# Patient Record
Sex: Male | Born: 1935 | Race: White | Hispanic: No | Smoking: Never smoker
Health system: Southern US, Community
[De-identification: ages and names within clinical notes are randomized; demographics above are authoritative.]

## PROBLEM LIST (undated history)

## (undated) DIAGNOSIS — G1221 Amyotrophic lateral sclerosis: Secondary | ICD-10-CM

## (undated) DIAGNOSIS — I10 Essential (primary) hypertension: Secondary | ICD-10-CM

## (undated) HISTORY — PX: APPENDECTOMY: SHX54

## (undated) HISTORY — PX: HERNIA REPAIR: SHX51

---

## 2015-07-19 ENCOUNTER — Emergency Department (HOSPITAL_BASED_OUTPATIENT_CLINIC_OR_DEPARTMENT_OTHER)
Admission: EM | Admit: 2015-07-19 | Discharge: 2015-07-19 | Disposition: A | Discharge status: Home / Self Care | Payer: Medicare Other | Attending: Emergency Medicine | Admitting: Emergency Medicine

## 2015-07-19 ENCOUNTER — Emergency Department (HOSPITAL_BASED_OUTPATIENT_CLINIC_OR_DEPARTMENT_OTHER): Payer: Medicare Other

## 2015-07-19 ENCOUNTER — Encounter (HOSPITAL_BASED_OUTPATIENT_CLINIC_OR_DEPARTMENT_OTHER): Payer: Self-pay | Admitting: *Deleted

## 2015-07-19 DIAGNOSIS — R21 Rash and other nonspecific skin eruption: Secondary | ICD-10-CM | POA: Diagnosis not present

## 2015-07-19 DIAGNOSIS — R630 Anorexia: Secondary | ICD-10-CM | POA: Diagnosis not present

## 2015-07-19 DIAGNOSIS — R109 Unspecified abdominal pain: Secondary | ICD-10-CM | POA: Diagnosis not present

## 2015-07-19 DIAGNOSIS — K838 Other specified diseases of biliary tract: Secondary | ICD-10-CM | POA: Insufficient documentation

## 2015-07-19 DIAGNOSIS — I10 Essential (primary) hypertension: Secondary | ICD-10-CM | POA: Insufficient documentation

## 2015-07-19 DIAGNOSIS — R110 Nausea: Secondary | ICD-10-CM

## 2015-07-19 DIAGNOSIS — R112 Nausea with vomiting, unspecified: Secondary | ICD-10-CM | POA: Diagnosis present

## 2015-07-19 DIAGNOSIS — Z8669 Personal history of other diseases of the nervous system and sense organs: Secondary | ICD-10-CM | POA: Diagnosis not present

## 2015-07-19 HISTORY — DX: Amyotrophic lateral sclerosis: G12.21

## 2015-07-19 HISTORY — DX: Essential (primary) hypertension: I10

## 2015-07-19 LAB — COMPREHENSIVE METABOLIC PANEL
ALBUMIN: 4.5 g/dL (ref 3.5–5.0)
ALT: 24 U/L (ref 17–63)
ANION GAP: 5 (ref 5–15)
AST: 39 U/L (ref 15–41)
Alkaline Phosphatase: 63 U/L (ref 38–126)
BILIRUBIN TOTAL: 1.4 mg/dL — AB (ref 0.3–1.2)
BUN: 22 mg/dL — ABNORMAL HIGH (ref 6–20)
CHLORIDE: 102 mmol/L (ref 101–111)
CO2: 31 mmol/L (ref 22–32)
Calcium: 10.3 mg/dL (ref 8.9–10.3)
Creatinine, Ser: 0.66 mg/dL (ref 0.61–1.24)
GFR calc non Af Amer: >60 mL/min (ref >60)
GLUCOSE: 98 mg/dL (ref 65–99)
Potassium: 3.7 mmol/L (ref 3.5–5.1)
SODIUM: 138 mmol/L (ref 135–145)
Total Protein: 8.3 g/dL — ABNORMAL HIGH (ref 6.5–8.1)

## 2015-07-19 LAB — CBC WITH DIFFERENTIAL/PLATELET
Basophils Absolute: 0.1 10*3/uL (ref 0.0–0.1)
Basophils Relative: 1 %
Eosinophils Absolute: 0.6 10*3/uL (ref 0.0–0.7)
Eosinophils Relative: 6 %
HEMATOCRIT: 45.7 % (ref 39.0–52.0)
HEMOGLOBIN: 15.5 g/dL (ref 13.0–17.0)
LYMPHS ABS: 1.6 10*3/uL (ref 0.7–4.0)
LYMPHS PCT: 17 %
MCH: 28.8 pg (ref 26.0–34.0)
MCHC: 33.9 g/dL (ref 30.0–36.0)
MCV: 84.8 fL (ref 78.0–100.0)
MONOS PCT: 7 %
Monocytes Absolute: 0.7 10*3/uL (ref 0.1–1.0)
NEUTROS ABS: 6.7 10*3/uL (ref 1.7–7.7)
NEUTROS PCT: 69 %
Platelets: 262 10*3/uL (ref 150–400)
RBC: 5.39 MIL/uL (ref 4.22–5.81)
RDW: 14.3 % (ref 11.5–15.5)
WBC: 9.6 10*3/uL (ref 4.0–10.5)

## 2015-07-19 LAB — URINALYSIS, ROUTINE W REFLEX MICROSCOPIC
Bilirubin Urine: NEGATIVE
GLUCOSE, UA: NEGATIVE mg/dL
HGB URINE DIPSTICK: NEGATIVE
Ketones, ur: 15 mg/dL — AB
LEUKOCYTES UA: NEGATIVE
Nitrite: NEGATIVE
PH: 5.5 (ref 5.0–8.0)
PROTEIN: NEGATIVE mg/dL
SPECIFIC GRAVITY, URINE: 1.017 (ref 1.005–1.030)
Urobilinogen, UA: 1 mg/dL (ref 0.0–1.0)

## 2015-07-19 LAB — TROPONIN I: Troponin I: 0.03 ng/mL (ref <0.031)

## 2015-07-19 MED ORDER — IOHEXOL 300 MG/ML  SOLN
25.0000 mL | Freq: Once | INTRAMUSCULAR | Status: AC | PRN
  Administered 2015-07-19: 25 mL via ORAL

## 2015-07-19 MED ORDER — SODIUM CHLORIDE 0.9 % IV BOLUS (SEPSIS)
1000.0000 mL | Freq: Once | INTRAVENOUS | Status: AC
  Administered 2015-07-19: 1000 mL via INTRAVENOUS

## 2015-07-19 MED ORDER — IOHEXOL 300 MG/ML  SOLN
100.0000 mL | Freq: Once | INTRAMUSCULAR | Status: AC | PRN
  Administered 2015-07-19: 100 mL via INTRAVENOUS

## 2015-07-19 MED ORDER — ONDANSETRON HCL 4 MG/2ML IJ SOLN
4.0000 mg | Freq: Once | INTRAMUSCULAR | Status: AC
  Administered 2015-07-19: 4 mg via INTRAVENOUS
  Filled 2015-07-19: qty 2

## 2015-07-19 MED ORDER — ONDANSETRON 4 MG PREPACK (~~LOC~~): 1.0000 | ORAL_TABLET | Freq: Three times a day (TID) | ORAL | Status: AC | PRN

## 2015-07-19 MED ORDER — METOPROLOL TARTRATE 1 MG/ML IV SOLN
5.0000 mg | Freq: Once | INTRAVENOUS | Status: AC
  Administered 2015-07-19: 5 mg via INTRAVENOUS
  Filled 2015-07-19: qty 5

## 2015-07-19 MED ORDER — DIPHENHYDRAMINE HCL 50 MG/ML IJ SOLN
25.0000 mg | Freq: Once | INTRAMUSCULAR | Status: AC
  Administered 2015-07-19: 25 mg via INTRAVENOUS
  Filled 2015-07-19: qty 1

## 2015-07-19 NOTE — ED Notes (Signed)
MD at the bedside  

## 2015-07-19 NOTE — ED Notes (Signed)
Applesauce and water given.

## 2015-07-19 NOTE — ED Notes (Signed)
I was able to assist patient to stand and give urine sample, patient back in bed with rails up and made comfortable with a blanket.

## 2015-07-19 NOTE — ED Notes (Signed)
Patient states he is unable to give urine sample at this time.

## 2015-07-19 NOTE — ED Notes (Signed)
Pt. Reports he has had nausea and vomited x 1.  Family believes the Pt. May have aspirated his vomit.

## 2015-07-19 NOTE — ED Provider Notes (Signed)
CSN: [id number]     Arrival date & time 07/19/15  1431 History   First MD Initiated Contact with Patient 07/19/15 1447     Chief Complaint  Patient presents with  . Nausea     (Consider location/radiation/quality/duration/timing/severity/associated sxs/prior Treatment) HPI  Patient with approximately 36 hours of nausea 4 episodes of dark green vomiting but no fevers, abdominal pain or other symptoms. Has last bowel movement this morning. No history of the same. No sick contacts or recent travels. No new medications or problems with his gallbladder in the past.  Past Medical History  Diagnosis Date  . ALS (amyotrophic lateral sclerosis) (HCC)   . Hypertension    Past Surgical History  Procedure Laterality Date  . Hernia repair    . Appendectomy     No family history on file. Social History  Substance Use Topics  . Smoking status: Never Smoker   . Smokeless tobacco: Never Used  . Alcohol Use: No    Review of Systems  Constitutional: Positive for appetite change. Negative for fever, chills and activity change.  Eyes: Negative for pain and itching.  Respiratory: Negative for cough and shortness of breath.   Gastrointestinal: Positive for nausea and vomiting. Negative for abdominal pain, diarrhea and constipation.  Endocrine: Negative for polydipsia and polyuria.  Genitourinary: Negative for dysuria, hematuria and difficulty urinating.  Musculoskeletal: Negative for back pain and neck pain.  Skin: Positive for rash (upper chest, back, arms and back). Negative for wound.  Neurological: Negative for dizziness and headaches.  Psychiatric/Behavioral: Negative for agitation.      Allergies  Review of patient's allergies indicates no known allergies.  Home Medications   Prior to Admission medications   Medication Sig Start Date End Date Taking? Authorizing Provider  ondansetron (ZOFRAN) 4 mg TABS tablet Take 4 tablets by mouth every 8 (eight) hours as needed. 07/19/15    Macallister Ashmead, MD   BP 182/99 mmHg  Pulse 67  Temp(Src) 98 F (36.7 C) (Oral)  Resp 19  SpO2 98% Physical Exam  Constitutional: He appears well-developed and well-nourished.  HENT:  Head: Normocephalic and atraumatic.  Mouth/Throat: Mucous membranes are dry.  Neck: Normal range of motion.  Cardiovascular: Normal rate.   Pulmonary/Chest: Effort normal. No respiratory distress. He has no wheezes. He has no rales.  Abdominal: He exhibits no distension.  Musculoskeletal: Normal range of motion.  Neurological: He is alert.  Skin: Rash (rash and excoriations over upper back and chest, proximal extremities) noted.  Nursing note and vitals reviewed.   ED Course  Procedures (including critical care time) Labs Review Labs Reviewed  COMPREHENSIVE METABOLIC PANEL - Abnormal; Notable for the following:    BUN 22 (*)    Total Protein 8.3 (*)    Total Bilirubin 1.4 (*)    All other components within normal limits  URINALYSIS, ROUTINE W REFLEX MICROSCOPIC (NOT AT [facility]) - Abnormal; Notable for the following:    Ketones, ur 15 (*)    All other components within normal limits  TROPONIN I  CBC WITH DIFFERENTIAL/PLATELET    Imaging Review Dg Chest 2 View  07/19/2015  CLINICAL DATA:  Cough, especially after eating. Vomiting for 2 days. Possible aspiration. EXAM: CHEST  2 VIEW COMPARISON:  None. FINDINGS: Heart size is normal. Overall cardiomediastinal silhouette is within normal limits in size and configuration, with mild age- related aortic ectasia. There is mild scarring/atelectasis at the left lung base. Lungs appear otherwise clear. No evidence of pneumonia or aspiration. No pleural  effusion. No pneumothorax. Mild degenerative change noted within the slightly kyphotic thoracic spine. No acute osseous abnormality. IMPRESSION: No evidence of acute cardiopulmonary abnormality. No evidence of pneumonia or aspiration. Electronically Signed   By: Bary Richard M.D.   On: 07/19/2015 15:42   US  Abdomen Complete  07/19/2015  CLINICAL DATA:  Abdominal pain with nausea and vomiting today. History of hernia repair and appendectomy. Initial encounter. EXAM: ULTRASOUND ABDOMEN COMPLETE COMPARISON:  CT same date. FINDINGS: Gallbladder: There is a wall echo shadow sign in the right upper quadrant, most consistent with a gallbladder filled with stones. No significant gallbladder wall thickening or pericholecystic fluid demonstrated sonographic Eulah Pont sign is absent. Common bile duct: Diameter: 11 mm. No intraductal calculi visualized. Liver: There is an echogenic shadowing lesion inferiorly in the right hepatic lobe, corresponding with calcification on CT. The liver otherwise appears unremarkable. IVC: Obscured by bowel gas and not visualized. Pancreas: Obscured by bowel gas and not visualized. Spleen: Size and appearance within normal limits. Right Kidney: Length: 12.2 cm. Echogenicity is within normal limits. There is a probable small cyst inferiorly. No hydronephrosis. Left Kidney: Length: 11.8 cm. There are renal cysts measuring up to 3.2 cm. No renal mass or hydronephrosis. Abdominal aorta: No aneurysm visualized. Other findings: None. IMPRESSION: 1. Cholelithiasis without evidence of cholecystitis. 2. Persistent extrahepatic biliary dilatation of undetermined etiology. The pancreas is obscured by bowel gas. 3. Bilateral renal cysts. 4. Study is mildly limited by bowel gas. Electronically Signed   By: Carey Bullocks M.D.   On: 07/19/2015 18:32   Ct Abdomen Pelvis W Contrast  07/19/2015  CLINICAL DATA:  Bilious vomiting and abdominal pain today. EXAM: CT ABDOMEN AND PELVIS WITH CONTRAST TECHNIQUE: Multidetector CT imaging of the abdomen and pelvis was performed using the standard protocol following bolus administration of intravenous contrast. CONTRAST:  OMNIPAQUE IOHEXOL 300 MG/ML SOLN, 25mL OMNIPAQUE IOHEXOL 300 MG/ML SOLN COMPARISON:  None. FINDINGS: Lower chest: Patchy consolidations at each  lung base, most likely atelectasis. Hepatobiliary: Common bile duct is distended to approximately 10 mm. No bile duct stone identified. No evidence of obstructing mass within the common bile duct. There is associated mild central intrahepatic bile duct dilatation. Probable stones noted within the nondistended gallbladder. Dystrophic benign-appearing calcification is seen within the right liver lobe liver otherwise unremarkable. No focal mass or lesion within the liver. Pancreas: Somewhat atrophic throughout but otherwise unremarkable. No obvious evidence of pancreatic duct dilatation. Spleen: Within normal limits in size and appearance. Adrenals/Urinary Tract: Multiple renal cysts bilaterally. No renal stone or hydronephrosis. No ureteral or bladder calculi identified. Stomach/Bowel: Moderate-sized stool ball within the rectal vault. Scattered diverticulosis within the descending and sigmoid colon but no inflammatory change to suggest acute diverticulitis. No dilated large or small bowel loops. Stomach appears normal. Vascular/Lymphatic: Scattered atherosclerotic changes of the normal- aorta. No acute vascular abnormality seen. No enlarged lymph nodes seen. Reproductive: Prostate gland is mildly enlarged causing slight mass effect on the bladder base. Otherwise unremarkable. Other: No free fluid or abscess collection seen. No free intraperitoneal air. Musculoskeletal: Scattered degenerative changes throughout the thoracolumbar spine but no acute osseous abnormality. IMPRESSION: 1. Dilated common bile duct, measuring approximately 10 mm diameter. No obstructing bile duct stone identified. No obstructing bile duct mass identified. Recommend correlation with liver function tests. ERCP or MRCP may be needed for more definitive characterization. 2. Probable gallstones but no evidence of acute cholecystitis. Could consider right upper quadrant ultrasound for confirmation and for further evaluation of the common  bile duct.  3. Colonic diverticulosis without evidence of acute diverticulitis. 4. No evidence of bowel obstruction. No bowel wall inflammation seen. 5. Other chronic/incidental findings detailed above. Electronically Signed   By: Bary Richard M.D.   On: 07/19/2015 17:12   I have personally reviewed and evaluated these images and lab results as part of my medical decision-making.   EKG Interpretation   Date/Time:  Sunday July 19 2015 15:42:57 EDT Ventricular Rate:  65 PR Interval:  172 QRS Duration: 88 QT Interval:  388 QTC Calculation: 403 R Axis:   -18 Text Interpretation:  Normal sinus rhythm with sinus arrhythmia Normal ECG  Confirmed by [facility] MD, Barbara Cower [id number]) on 07/19/2015 4:47:24 PM      MDM   Final diagnoses:  Common bile duct dilation   Unclear cause, will eval for dehydration, hepatic cause, cardiac cause, pulmonary cause, GU. Possibly bilious vomiting, will get CT if creatinine appropriate.   Labs and imaging all relatively unremarkable aside from a dilated common bile duct which may be an anatomic variant. US DONE TO EVALUATE FOR STONES OR ANY CHOLECYSTITIS WAS ALSO NEGATIVE. DISCUSSED THE CASE WITH GASTROENTEROLOGY THEY STATED THE PATIENT IS TOLERATE BY MOUTH AND HAS NO EVIDENCE OF CHOLANGITIS OR CHOLECYSTITIS THAT HE CAN BE DISCHARGED TO FOLLOW-UP WITH HIS PRIMARY DOCTOR FOR FURTHER IMAGING TO WORKUP THE CAUSE OF THE BILE DUCT DILATION. REPEAT EXAMINATION PATIENT STILL TOLERATING BY MOUTH WITH NO PAIN NO EVIDENCE OF SEPSIS. DID NOT TAKE HIS BLOOD PRESSURE MEDICATIONS SO WAS ADMINISTERED HERE HOWEVER IS ASYMPTOMATIC FROM HIS ELEVATED BLOOD PRESSURE SO HE WILL CONTINUE TAKING HIS BLOOD PRESSURE HOME AND GO FOR HIS RECHECK AT HIS PRIMARY DOCTOR.   I have personally and contemperaneously reviewed labs and imaging and used in my decision making as above.   A medical screening exam was performed and I feel the patient has had an appropriate workup for their chief complaint at this time  and likelihood of emergent condition existing is low. They have been counseled on decision, discharge, follow up and which symptoms necessitate immediate return to the emergency department. They or their family verbally stated understanding and agreement with plan and discharged in stable condition.      Marily Memos, MD 07/20/15 [phone]

## 2015-07-19 NOTE — ED Notes (Signed)
Pt assisted to stand at side of stretcher to void, gait unsteady

## 2015-07-19 NOTE — ED Notes (Signed)
Patient's family friends are at bedside, they said patient is on bi-pap machine often at home as needed. Friends stated patient complained of headache currently, and they stated patient missed his BP medication today. As I took vitals at bedside, I increased O2 to 3% by canula and got final result of 98 % saturation. I also took BP with result of 190/93 and and then re-took with same size cuff a little higher on arm with result of 186/104. I notified nurse and told respiratory therapist.

## 2016-03-04 IMAGING — CT CT ABD-PELV W/ CM
2 of 5 series · 15 of 46 positions shown, 17 images · IV contrast (APPLIED)
Comparison: None.

CLINICAL DATA: Bilious vomiting and abdominal pain today.

EXAM:
CT ABDOMEN AND PELVIS WITH CONTRAST
TECHNIQUE: Multidetector CT imaging of the abdomen and pelvis was performed
using the standard protocol following bolus administration of
intravenous contrast.
CONTRAST:  100mL OMNIPAQUE IOHEXOL 300 MG/ML SOLN, 25mL OMNIPAQUE
IOHEXOL 300 MG/ML SOLN

[Series 2: abd/pelvis 5.0 b31f · axial · 0.64mm/px · z∈[-440,+10]mm · 12 of 100 slices shown, 14 images]
[im 5/100  soft-tissue]
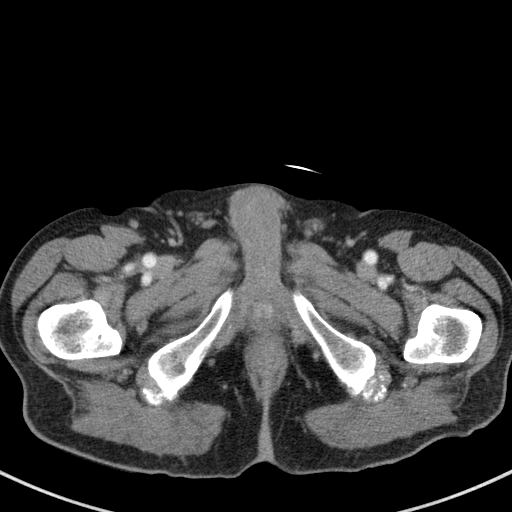
[im 5/100  bone]
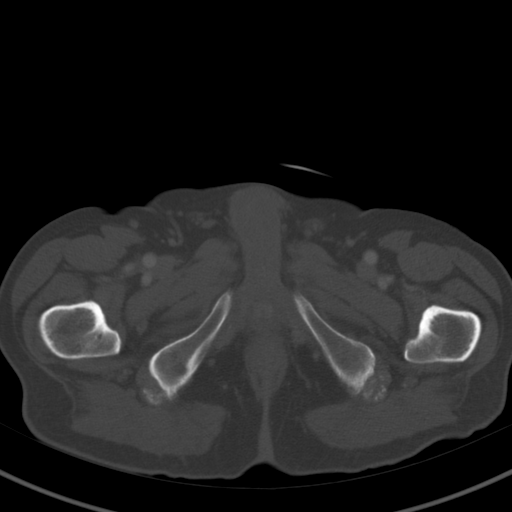
[im 15/100  soft-tissue]
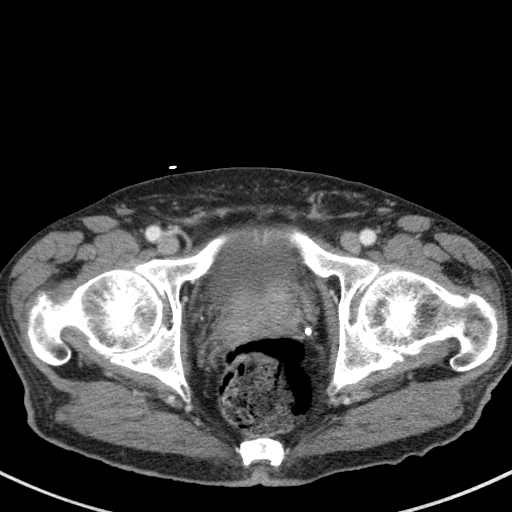
[im 20/100  soft-tissue]
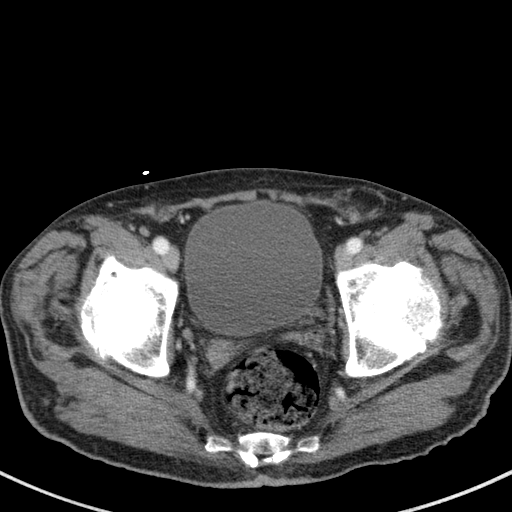
[im 30/100  soft-tissue]
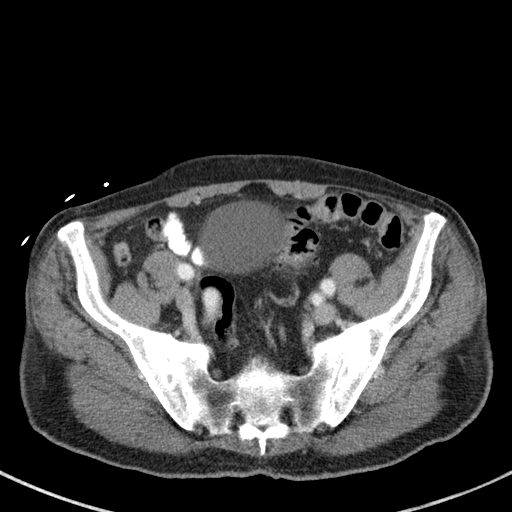
[im 40/100  soft-tissue]
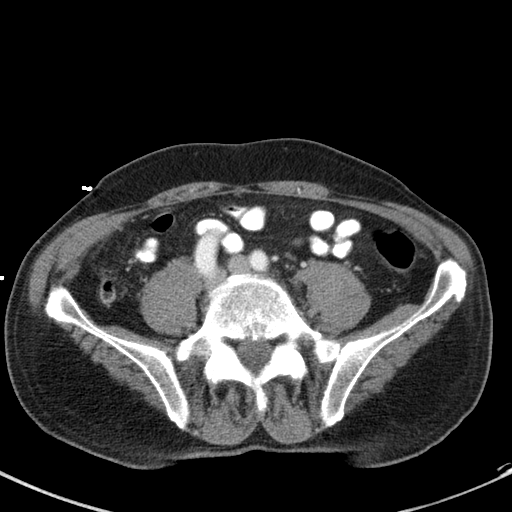
[im 45/100  soft-tissue]
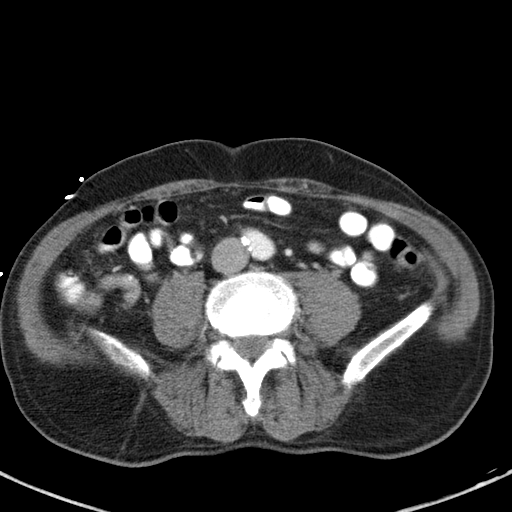
[im 55/100  soft-tissue]
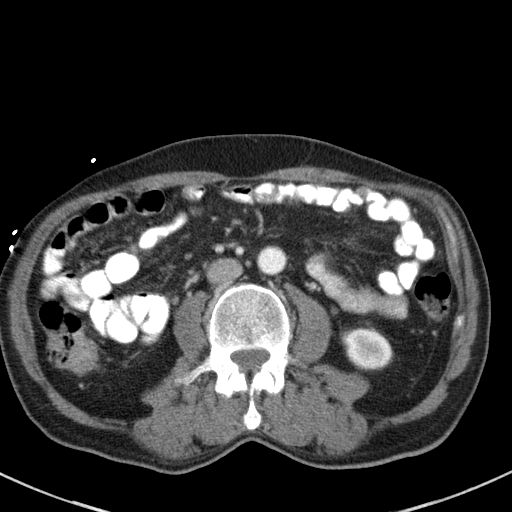
[im 60/100  soft-tissue]
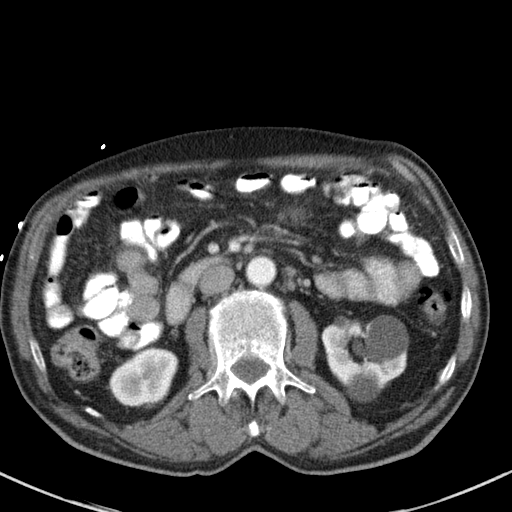
[im 70/100  soft-tissue]
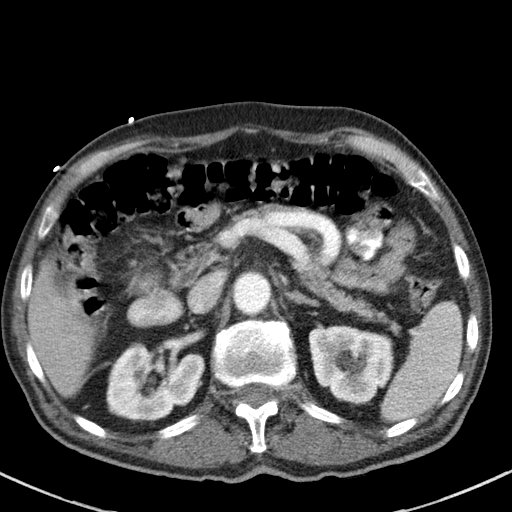
[im 70/100  bone]
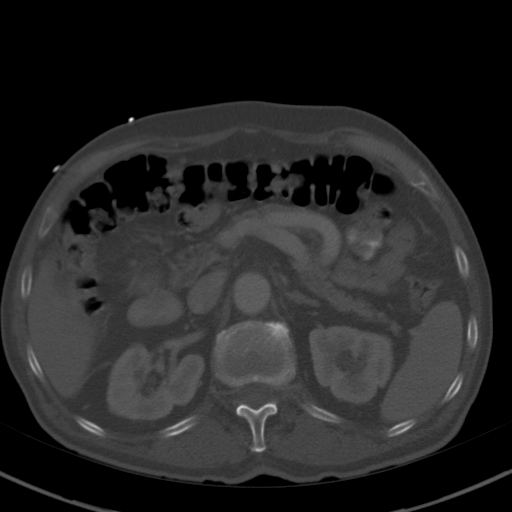
[im 80/100  soft-tissue]
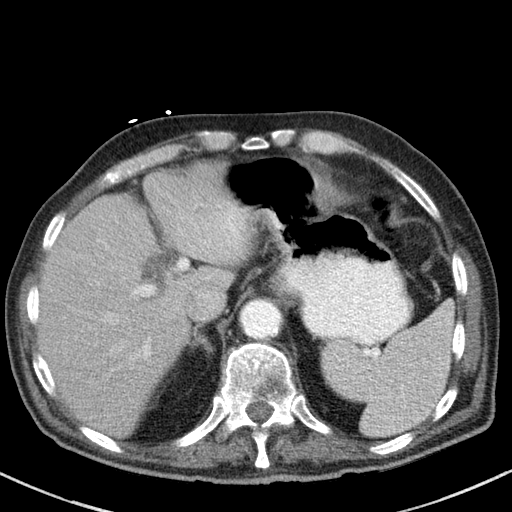
[im 85/100  soft-tissue]
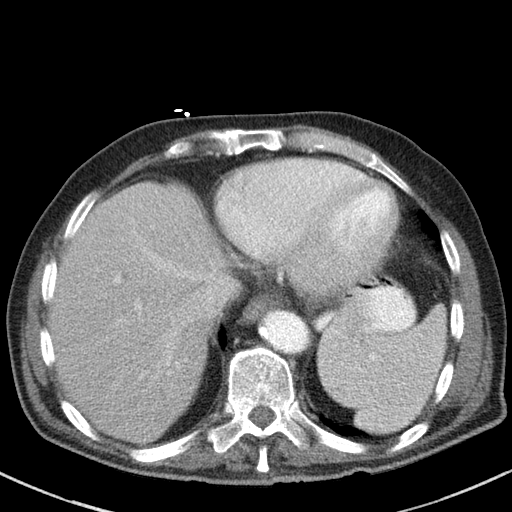
[im 95/100  soft-tissue]
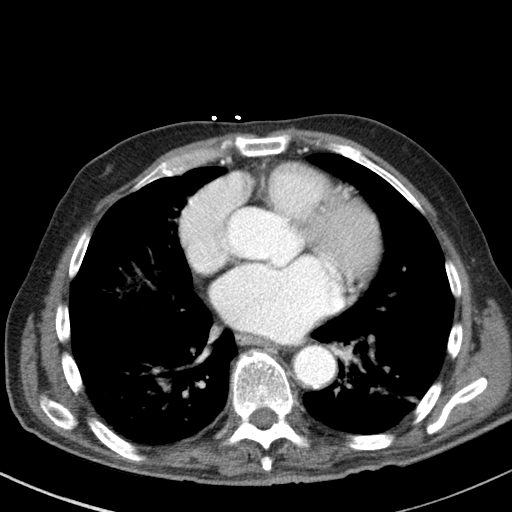

[Series 5: abd/pelvis 3.0 coronal · coronal · 0.79mm/px · 3 of 76 slices shown]
[im 26/76  soft-tissue]
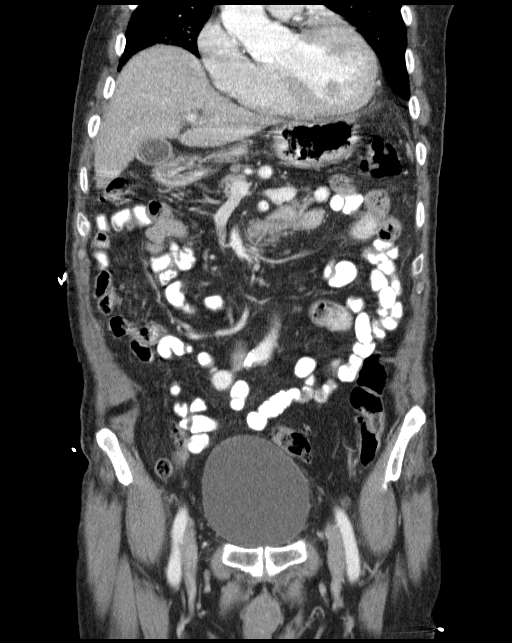
[im 34/76  soft-tissue]
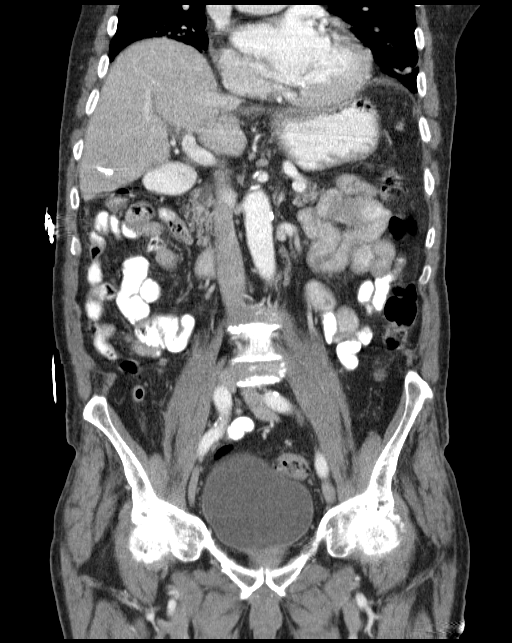
[im 42/76  soft-tissue]
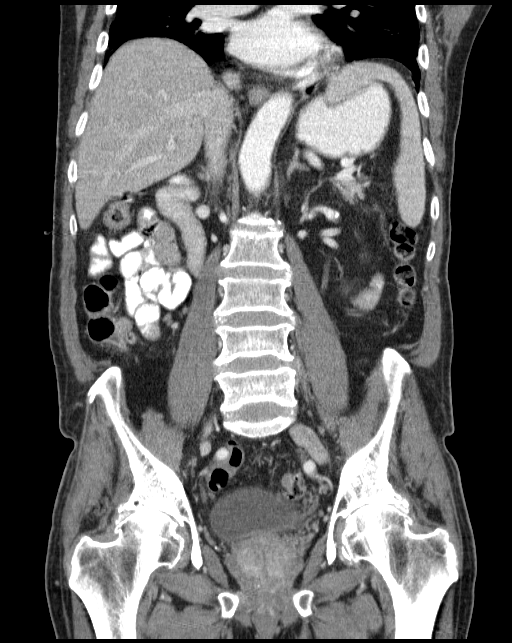

[15 of 46 positions shown; findings below may reference images not displayed]

FINDINGS: Lower chest: Patchy consolidations at each lung base, most likely
atelectasis.

Hepatobiliary: Common bile duct is distended to approximately 10 mm.
No bile duct stone identified. No evidence of obstructing mass
within the common bile duct. There is associated mild central
intrahepatic bile duct dilatation. Probable stones noted within the
nondistended gallbladder.

Dystrophic benign-appearing calcification is seen within the right
liver lobe liver otherwise unremarkable. No focal mass or lesion
within the liver.

Pancreas: Somewhat atrophic throughout but otherwise unremarkable.
No obvious evidence of pancreatic duct dilatation.

Spleen: Within normal limits in size and appearance.

Adrenals/Urinary Tract: Multiple renal cysts bilaterally. No renal
stone or hydronephrosis. No ureteral or bladder calculi identified.

Stomach/Bowel: Moderate-sized stool ball within the rectal vault.
Scattered diverticulosis within the descending and sigmoid colon but
no inflammatory change to suggest acute diverticulitis. No dilated
large or small bowel loops. Stomach appears normal.

Vascular/Lymphatic: Scattered atherosclerotic changes of the normal-
aorta. No acute vascular abnormality seen. No enlarged lymph nodes
seen.

Reproductive: Prostate gland is mildly enlarged causing slight mass
effect on the bladder base. Otherwise unremarkable.

Other: No free fluid or abscess collection seen. No free
intraperitoneal air.

Musculoskeletal: Scattered degenerative changes throughout the
thoracolumbar spine but no acute osseous abnormality.
IMPRESSION: 1. Dilated common bile duct, measuring approximately 10 mm diameter.
No obstructing bile duct stone identified. No obstructing bile duct
mass identified. Recommend correlation with liver function tests.
ERCP or MRCP may be needed for more definitive characterization.
2. Probable gallstones but no evidence of acute cholecystitis. Could
consider right upper quadrant ultrasound for confirmation and for
further evaluation of the common bile duct.
3. Colonic diverticulosis without evidence of acute diverticulitis.
4. No evidence of bowel obstruction. No bowel wall inflammation
seen.
5. Other chronic/incidental findings detailed above.

## 2016-03-04 IMAGING — DX DG CHEST 2V
2 series · 2 of 2 positions shown · non-contrast
Comparison: None.

CLINICAL DATA: Cough, especially after eating. Vomiting for 2 days.
Possible aspiration.

EXAM:
CHEST  2 VIEW

[chest pa]
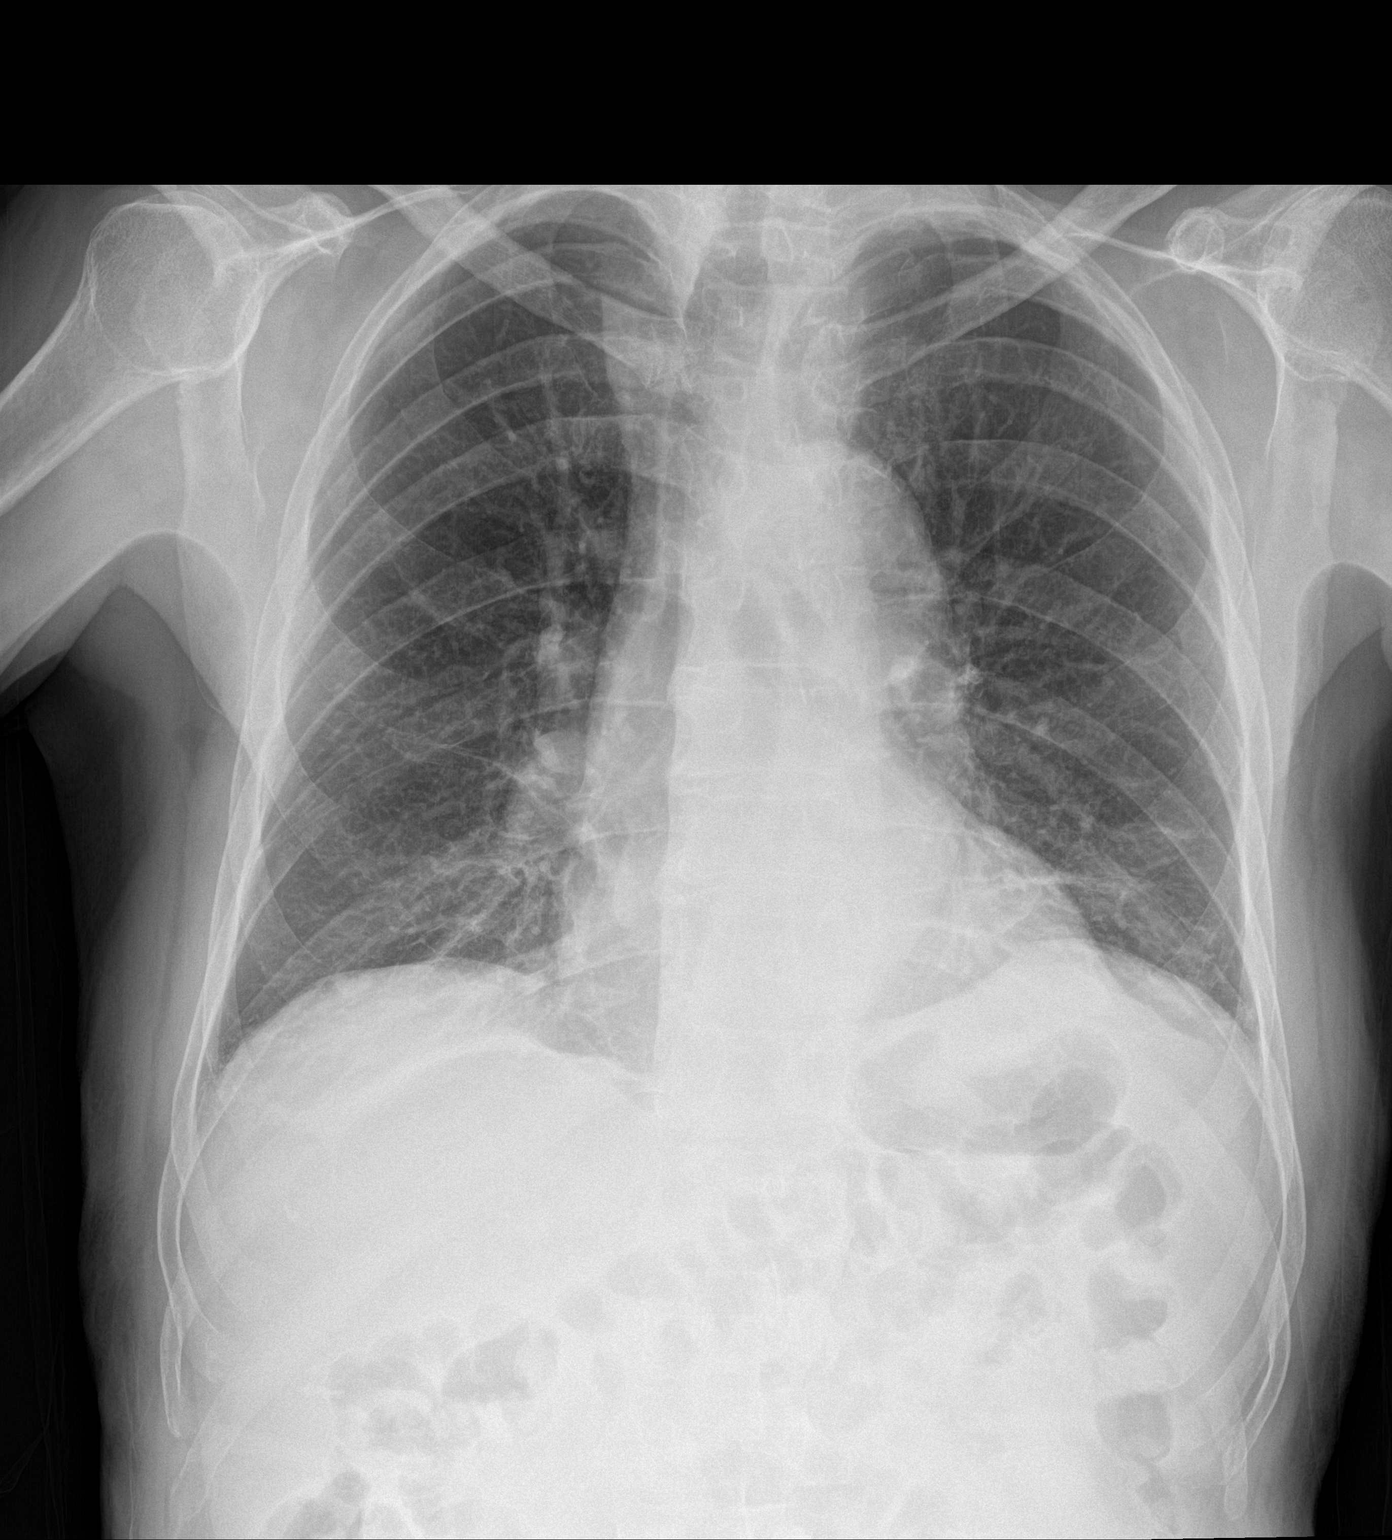

[chest lat]
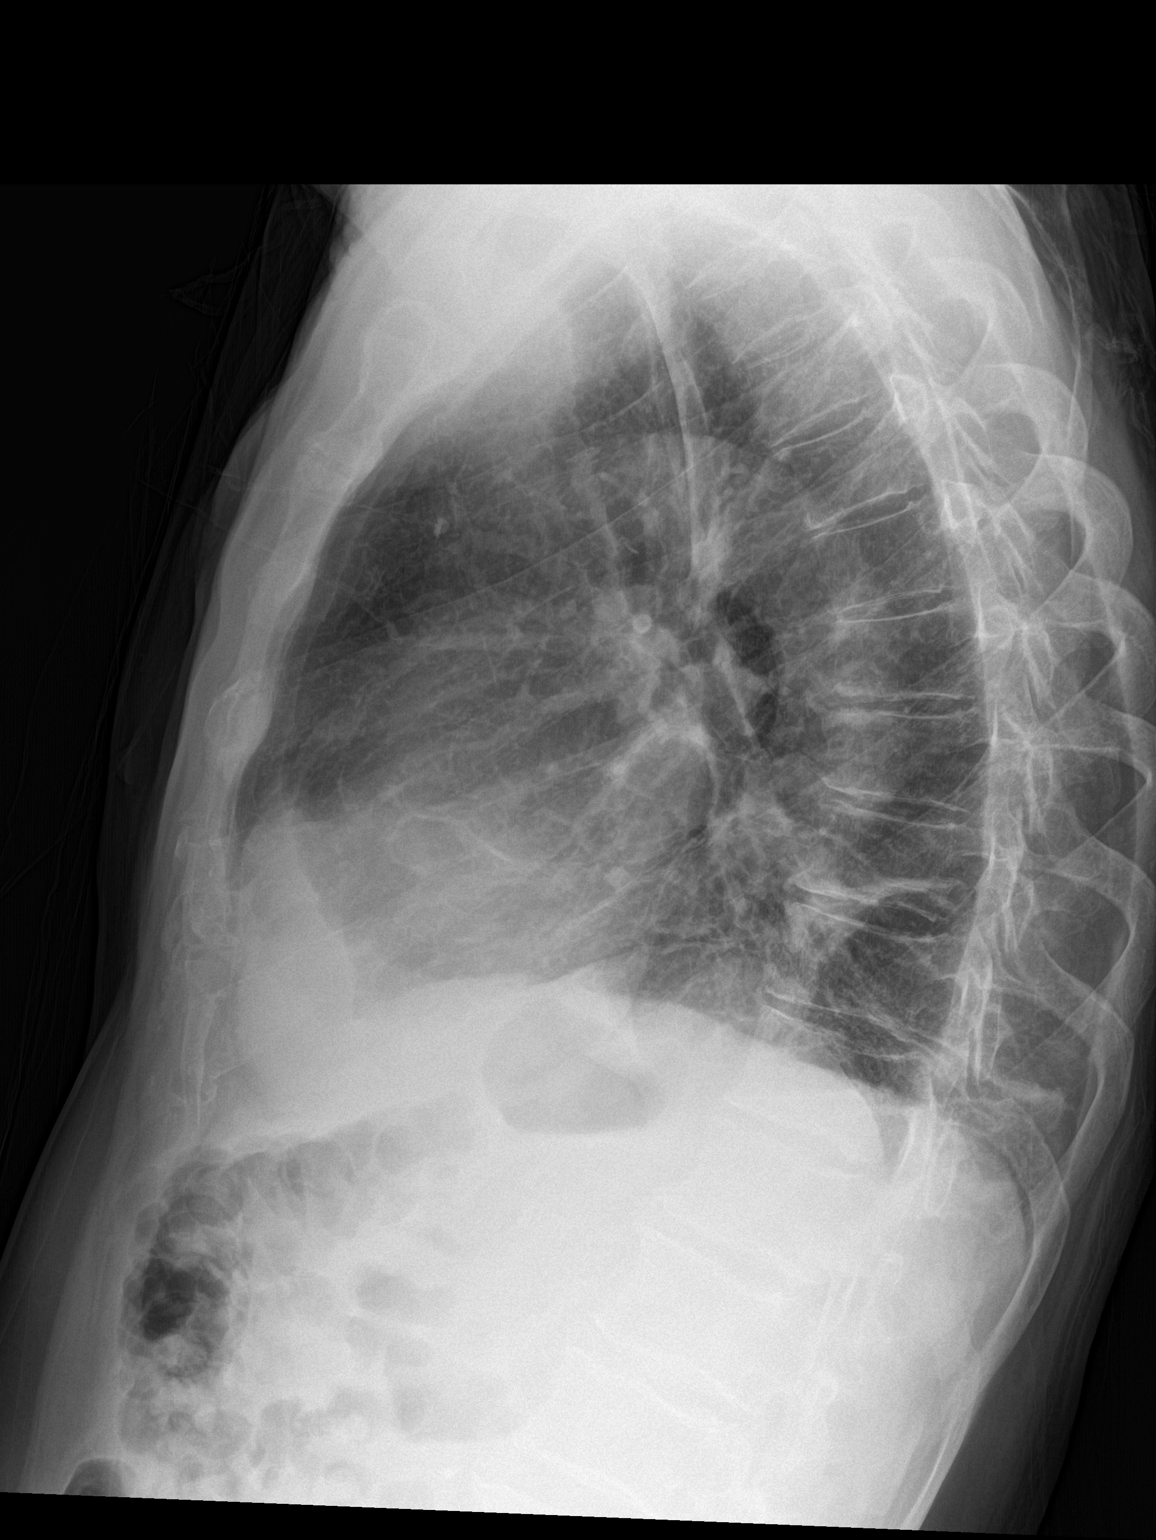

[2 of 2 positions shown; findings below may reference images not displayed]

FINDINGS: Heart size is normal. Overall cardiomediastinal silhouette is within
normal limits in size and configuration, with mild age- related
aortic ectasia.

There is mild scarring/atelectasis at the left lung base. Lungs
appear otherwise clear. No evidence of pneumonia or aspiration. No
pleural effusion. No pneumothorax.

Mild degenerative change noted within the slightly kyphotic thoracic
spine. No acute osseous abnormality.
IMPRESSION: No evidence of acute cardiopulmonary abnormality. No evidence of
pneumonia or aspiration.

## 2016-03-19 DEATH — deceased
